# Patient Record
Sex: Female | Born: 1945
Health system: Southern US, Community
[De-identification: ages and names within clinical notes are randomized; demographics above are authoritative.]

## PROBLEM LIST (undated history)

## (undated) DIAGNOSIS — E785 Hyperlipidemia, unspecified: Secondary | ICD-10-CM

## (undated) DIAGNOSIS — L989 Disorder of the skin and subcutaneous tissue, unspecified: Secondary | ICD-10-CM

## (undated) DIAGNOSIS — I10 Essential (primary) hypertension: Secondary | ICD-10-CM

## (undated) DIAGNOSIS — E7849 Other hyperlipidemia: Secondary | ICD-10-CM

## (undated) HISTORY — DX: Hyperlipidemia, unspecified: E78.5

## (undated) HISTORY — DX: Essential (primary) hypertension: I10

## (undated) HISTORY — DX: Other hyperlipidemia: E78.49

## (undated) HISTORY — DX: Disorder of the skin and subcutaneous tissue, unspecified: L98.9

---

## 2005-12-15 ENCOUNTER — Ambulatory Visit (HOSPITAL_COMMUNITY): Admission: RE | Admit: 2005-12-15 | Discharge: 2005-12-15 | Payer: Self-pay | Admitting: Pulmonary Disease

## 2006-01-07 ENCOUNTER — Ambulatory Visit (HOSPITAL_COMMUNITY): Admission: RE | Admit: 2006-01-07 | Discharge: 2006-01-07 | Payer: Self-pay | Admitting: Pulmonary Disease

## 2012-03-15 DIAGNOSIS — E785 Hyperlipidemia, unspecified: Secondary | ICD-10-CM | POA: Diagnosis not present

## 2012-03-15 DIAGNOSIS — Z23 Encounter for immunization: Secondary | ICD-10-CM | POA: Diagnosis not present

## 2012-12-16 DIAGNOSIS — Z79899 Other long term (current) drug therapy: Secondary | ICD-10-CM | POA: Diagnosis not present

## 2012-12-16 DIAGNOSIS — E785 Hyperlipidemia, unspecified: Secondary | ICD-10-CM | POA: Diagnosis not present

## 2012-12-16 DIAGNOSIS — I10 Essential (primary) hypertension: Secondary | ICD-10-CM | POA: Diagnosis not present

## 2012-12-20 ENCOUNTER — Encounter: Payer: Self-pay | Admitting: Obstetrics and Gynecology

## 2012-12-22 ENCOUNTER — Ambulatory Visit (INDEPENDENT_AMBULATORY_CARE_PROVIDER_SITE_OTHER): Payer: PRIVATE HEALTH INSURANCE | Admitting: Obstetrics and Gynecology

## 2012-12-22 ENCOUNTER — Encounter: Payer: Self-pay | Admitting: Obstetrics and Gynecology

## 2012-12-22 VITALS — BP 120/82 | Ht 63.0 in | Wt <= 1120 oz

## 2012-12-22 DIAGNOSIS — N8111 Cystocele, midline: Secondary | ICD-10-CM

## 2012-12-22 NOTE — Progress Notes (Signed)
Patient ID: Ruth Barry, female   DOB: 04/15/1946, 67 y.o.   MRN: [id number] Pt here today to check her bladder. Pt states she thinks her bladder has dropped. Referred Dr Juanetta Gosling office. prob x 6  Mos. SUI sx: good control Urge:  Morning urge.doesnt lose control BM's okay; Surg hx no gyn surgery Physical Examination: BP 120/82  Ht 5\' 3"  (1.6 m)  Wt 11 lb 9.6 oz (5.262 kg)  BMI 2.06 kg/m2 Physical Examination: General appearance - alert, well appearing, and in no distress, oriented to person, place, and time and normal appearing weight Mental status - alert, oriented to person, place, and time, normal mood, behavior, speech, dress, motor activity, and thought processes  Eyes - pupils equal and reactive, extraocular eye movements intact Neck - supple, no significant adenopathy Heart - normal rate and regular rhythm Abdomen - soft, nontender, nondistended, no masses or organomegaly Pelvic - normal external genitalia, vulva, vagina, cervix, uterus and adnexa, exam chaperoned by RN janet.Marland Kitchen

## 2012-12-22 NOTE — Patient Instructions (Signed)
No evidence of cystocele.  You are doing very well.  Below is informaiton on cystocele just for education. Cystocele Repair A cystocele is a bulging, drooping hernia or break (rupture) of bladder tissue into the birth canal (vagina). This bulging or rupture occurs on the top front wall of the vagina. CAUSES  Cystocele is associated with weakness of the top front wall of the vagina due to stretching and tearing of the ligaments and muscles in the area. This is often the result of:  Multiple childbirths.  Continuous heavy lifting.  Chronic cough from asthma, emphysema, or smoking.  Being overweight.  Changes from aging.  Previous surgery in the vaginal area.  Menopause with loss of estrogen hormone and weakening of the ligaments and muscles around the bladder. SYMPTOMS   Uncontrolled loss of urine (incontinence) with cough, sneeze, or exercise.  Pelvic pressure.  Frequency or urgency to urinate because of inability to completely empty the bladder.  Bladder infections.  Needing to push on the upper vagina to help yourself pass urine. DIAGNOSIS  A cystocele can be diagnosed by doing a pelvic exam and observing the top of the vagina drooping or bulging into or out of the vagina. TREATMENT  Surgical options:  Cystocele repair is surgery that removes the hernia.  There are also different "sling" operations that may be used. Discuss the different types of surgeries to repair a cystocele with your caregiver. Your caregiver will decide what type of surgery will be best in your case. Nonsurgical options:  Kegel exercises. This helps strengthen and tighten the muscles and tissue in and around the bladder and vagina. This may help with mild cases of cystocele.  A pessary may help the cystocele. A pessary is a plastic or rubber device that lifts the bladder into place. A pessary must be fitted by a doctor.  Tampons or diaphragms that lift the bladder into place are sometimes helpful  with a minor or small cystocele.  Estrogen may help with mild cases in menopausal and aging women. LET YOUR CAREGIVER KNOW ABOUT:   Allergies to food or medicine.  Medicines taken, including vitamins, herbs, eyedrops, over-the-counter medicines, and creams.  Use of steroids (by mouth or creams).  Previous problems with anesthetics or numbing medicines.  History of bleeding problems or blood clots.  Previous surgery.  Other health problems, including diabetes and kidney problems.  Possibility of pregnancy, if this applies. RISKS AND COMPLICATIONS  All surgery is associated with risks.  There are risks with a general anesthesia. You should discuss this with your caregiver.  With spinal or epidural anesthesia, there may be an area that is not numbed, and you could feel pain.  Headache could occur with a spinal or epidural anesthetic.  The catheter you will have after surgery may not work properly or may get blocked and need to be replaced.  Excessive bleeding.  Infection.  Injury to surrounding structures.  Recurrence of the cystocele.  Surgery may not get rid of your symptoms. BEFORE THE PROCEDURE   Do not take aspirin or blood thinners for 1 week prior to surgery, unless instructed otherwise.  Do not eat or drink anything after midnight the night before surgery.  Let your caregiver know if you develop a cold or other infectious problems prior to surgery.  If being admitted the day of surgery, you should be present 1 hour prior to your procedure or as directed by your caregiver.  Plan and arrange for help when you go home from the  hospital.  If you smoke, do not smoke for at least 2 weeks before the surgery.  Do not drink any alcohol for 3 days before the surgery. PROCEDURE  You will be given an anesthetic to prevent you from feeling pain during surgery. This may be a general anesthetic that puts you to sleep, or a spinal or epidural anesthetic. You will be  asleep or be numbed through the entire procedure. During cystocele repair, tissue is pulled from the sides and around the top of the vagina to lift up the hernia. This removes the hernia so that the top of the vagina does not fall into the opening of the vagina. AFTER THE PROCEDURE  After surgery, you will be taken to the recovery room where a nurse will take care of you, checking your breathing, blood pressure, pulse, and your progress. When your caregiver feels you are stable, you will be taken to your room. You will have a drainage tube (Foley catheter) that will drain your bladder for 2 to 7 days or longer, until your bladder is working properly. This catheter is placed prior to surgery to help keep your bladder empty and out of the way during the procedure. After surgery, this will make passing your urine easier. The catheter will be removed when you can easily pass urine without this assistance. You may have gauze packing in the vagina that will be removed 1 to 2 days after the surgery. Usually, you will be given a medicine (antibiotic) that kills germs. You will be given pain medicine as needed. You can usually go home in 3 to 5 days. HOME CARE INSTRUCTIONS   Do not take baths. Take showers until your caregiver informs you otherwise.  Take antibiotics as directed by your caregiver.  Exercise as instructed. Do not perform exercises which increase the pressure inside your belly (abdomen), such as sit-ups or lifting weights, until your caregiver has given permission. Walking exercise is preferred.  Only take over-the-counter or prescription medicines for pain and discomfort as directed by your caregiver.  Do not drink alcohol while taking pain medicine.  Do not lift anything over 5 pounds.  Do not drive until your caregiver gives you permission.  Get plenty of rest and sleep.  Have someone help with your household chores for 1 to 2 weeks.  If you develop constipation, you may take a mild  laxative with your caregiver's permission. Eating bran foods and drinking enough water and fluids to keep your urine clear or pale yellow helps with constipation.  Do not take aspirin. It may cause bleeding.  You may resume normal diet and unstrenuous activities as directed.  Do not douche, use tampons, or engage in intercourse until your surgeon has given permission.  Change bandages (dressings) as directed.  Make and keep all your postoperative appointments. SEEK MEDICAL CARE IF:   You have abnormal vaginal discharge.  You develop a rash.  You are having a reaction to your medicine.  You develop nausea or vomiting. SEEK IMMEDIATE MEDICAL CARE IF:   You have redness, swelling, or increasing pain in the vaginal area.  You notice pus coming from the vagina.  You have a fever.  You notice a bad smell coming from the vagina.  You have increasing abdominal pain.  You have frequent urination or you notice burning during urination.  You notice blood in your urine.  You have excessive vaginal bleeding.  You cannot urinate. MAKE SURE YOU:   Understand these instructions.  Will watch  your condition.  Will get help right away if you are not doing well or get worse. Document Released: 05/23/2000 Document Revised: 08/18/2011 Document Reviewed: 08/23/2009 [facility] Patient Information 2014 [place], [place].

## 2013-02-09 DIAGNOSIS — Z23 Encounter for immunization: Secondary | ICD-10-CM | POA: Diagnosis not present

## 2014-03-09 DIAGNOSIS — Z23 Encounter for immunization: Secondary | ICD-10-CM | POA: Diagnosis not present

## 2014-04-04 DIAGNOSIS — I10 Essential (primary) hypertension: Secondary | ICD-10-CM | POA: Diagnosis not present

## 2014-04-04 DIAGNOSIS — Z79899 Other long term (current) drug therapy: Secondary | ICD-10-CM | POA: Diagnosis not present

## 2014-04-04 DIAGNOSIS — Z0000 Encounter for general adult medical examination without abnormal findings: Secondary | ICD-10-CM | POA: Diagnosis not present

## 2014-04-04 DIAGNOSIS — E784 Other hyperlipidemia: Secondary | ICD-10-CM | POA: Diagnosis not present

## 2014-04-04 LAB — TSH: TSH: 1.48 u[IU]/mL (ref 0.41–5.90)

## 2014-04-04 LAB — LIPID PANEL
Cholesterol: 204 mg/dL — AB (ref 0–200)
HDL: 60 mg/dL (ref 35–70)
LDL CALC: 127 mg/dL
TRIGLYCERIDES: 86 mg/dL (ref 40–160)

## 2014-04-04 LAB — CBC AND DIFFERENTIAL
HCT: 41 % (ref 36–46)
Hemoglobin: 13.9 g/dL (ref 12.0–16.0)
Platelets: 260 10*3/uL (ref 150–399)
WBC: 4.3 10*3/mL

## 2014-04-10 DIAGNOSIS — E784 Other hyperlipidemia: Secondary | ICD-10-CM | POA: Diagnosis not present

## 2014-04-10 DIAGNOSIS — N8111 Cystocele, midline: Secondary | ICD-10-CM | POA: Diagnosis not present

## 2014-04-10 DIAGNOSIS — I10 Essential (primary) hypertension: Secondary | ICD-10-CM | POA: Diagnosis not present

## 2014-04-11 DIAGNOSIS — I10 Essential (primary) hypertension: Secondary | ICD-10-CM | POA: Diagnosis not present

## 2014-04-11 DIAGNOSIS — E784 Other hyperlipidemia: Secondary | ICD-10-CM | POA: Diagnosis not present

## 2014-04-11 DIAGNOSIS — N8111 Cystocele, midline: Secondary | ICD-10-CM | POA: Diagnosis not present

## 2014-04-18 ENCOUNTER — Encounter: Payer: Self-pay | Admitting: *Deleted

## 2014-04-24 ENCOUNTER — Encounter: Payer: Self-pay | Admitting: Obstetrics and Gynecology

## 2014-04-24 ENCOUNTER — Ambulatory Visit (INDEPENDENT_AMBULATORY_CARE_PROVIDER_SITE_OTHER): Payer: Medicare Other | Admitting: Obstetrics and Gynecology

## 2014-04-24 ENCOUNTER — Other Ambulatory Visit (HOSPITAL_COMMUNITY)
Admission: RE | Admit: 2014-04-24 | Discharge: 2014-04-24 | Disposition: A | Discharge status: Home / Self Care | Payer: Medicare Other | Source: Ambulatory Visit | Attending: Obstetrics and Gynecology | Admitting: Obstetrics and Gynecology

## 2014-04-24 VITALS — BP 130/80 | Ht 63.0 in | Wt 113.0 lb

## 2014-04-24 DIAGNOSIS — N8110 Cystocele, unspecified: Secondary | ICD-10-CM

## 2014-04-24 DIAGNOSIS — Z124 Encounter for screening for malignant neoplasm of cervix: Secondary | ICD-10-CM

## 2014-04-24 DIAGNOSIS — Z1151 Encounter for screening for human papillomavirus (HPV): Secondary | ICD-10-CM | POA: Diagnosis not present

## 2014-04-24 DIAGNOSIS — Z01419 Encounter for gynecological examination (general) (routine) without abnormal findings: Secondary | ICD-10-CM | POA: Insufficient documentation

## 2014-04-24 DIAGNOSIS — IMO0002 Reserved for concepts with insufficient information to code with codable children: Secondary | ICD-10-CM

## 2014-04-24 LAB — HM PAP SMEAR: HM Pap smear: NEGATIVE

## 2014-04-24 NOTE — Patient Instructions (Signed)

## 2014-04-24 NOTE — Progress Notes (Signed)
Patient ID: Ruth Barry, female   DOB: 06-04-1946, 68 y.o.   MRN: [id number]   [facility] Visit  Patient name: Ruth Barry MRN [id number]  Date of birth: 04-05-1946  CC & HPI:  Ruth Barry is a 68 y.o. female presenting today for trouble emptying her bladder due to a possible bladder drop.  Positive for urgency symptoms but not for incontinence and dysuria.  Her last PAP was more than 5 years ago.  She was referred here by her PCP Dr. Luan Pulling.  She does not have a history of hysterectomy.  She has a history of 2, normal vaginal deliveries.    ROS:  All systems have been reviewed and are negative unless otherwise indicated in the HPI.  Pertinent History Reviewed:   Reviewed: Significant for  Medical         Past Medical History  Diagnosis Date  . Hypertension   . Hyperlipidemia                               Surgical Hx:   History reviewed. No pertinent past surgical history. Medications: Reviewed & Updated - see associated section                      Current outpatient prescriptions: amLODipine (NORVASC) 5 MG tablet, Take 5 mg by mouth daily., Disp: , Rfl: ;  benazepril (LOTENSIN) 20 MG tablet, Take 20 mg by mouth daily., Disp: , Rfl: ;  lovastatin (MEVACOR) 40 MG tablet, Take 40 mg by mouth at bedtime., Disp: , Rfl:    Social History: Reviewed -  reports that she has never smoked. She has never used smokeless tobacco.  Objective Findings:  Vitals: Blood pressure 130/80, height 5\' 3"  (1.6 m), weight 113 lb (51.256 kg).  Physical Examination: General appearance - alert, well appearing, and in no distress, oriented to person, place, and time and normal appearing weight Pelvic - EXTERNAL GENITALIA: normal VULVA: normal appearing vulva with no masses, tenderness or lesions,  VAGINA: normal appearing vagina with normal color and discharge, no lesions, large cystocele with second degree uterine descensus,  CERVIX: normal appearing cervix without discharge or lesions,   UTERUS: uterus is normal size, shape, consistency and nontender, retroverted, mobile, small ADNEXA: normal adnexa in size, nontender and no masses RECTAL: some loss of support anteriorly   Assessment & Plan:   A:  1. Large cystocele with rotation of anterior wall with valsalva  2. Second degree uterine descensus  Pt currently not interested in hysterectomy, pro's and con's of uterine preservation discussed at length, and pt aware that there is  A real possibility of continued progression of the uterine descensus, and pt accepts that risk   P:  1. Discuss pessaries vs. Surgical options pt wants surgery, cystocele repair only 2. Will order pelvic u/s to insure there is no abnormalities of uterus or adnexa 3. Pap done.  This chart was scribed for [name], MD by Donato Schultz, ED Scribe. This patient was seen in Room 2 and the patient's care was started at 10:31 AM.

## 2014-04-24 NOTE — Progress Notes (Signed)
Patient ID: Ruth Barry, female   DOB: 25-Jan-1946, 68 y.o.   MRN: [id number] Pt here today for possible bladder drop. Dr. Luan Pulling sent her here to check the symptoms she is having. Pt states that she can feel something down there every morning. Pt states that she has trouble emptying her bladder at times. Pt denies any pain but states that she has trouble emptying her bladder.

## 2014-04-25 ENCOUNTER — Other Ambulatory Visit: Payer: Self-pay | Admitting: Obstetrics and Gynecology

## 2014-04-25 DIAGNOSIS — IMO0002 Reserved for concepts with insufficient information to code with codable children: Secondary | ICD-10-CM

## 2014-04-25 DIAGNOSIS — N814 Uterovaginal prolapse, unspecified: Secondary | ICD-10-CM

## 2014-04-26 LAB — CYTOLOGY - PAP

## 2014-05-01 ENCOUNTER — Other Ambulatory Visit: Payer: Self-pay | Admitting: Obstetrics and Gynecology

## 2014-05-01 ENCOUNTER — Ambulatory Visit (INDEPENDENT_AMBULATORY_CARE_PROVIDER_SITE_OTHER): Payer: Medicare Other | Admitting: Obstetrics and Gynecology

## 2014-05-01 ENCOUNTER — Ambulatory Visit (INDEPENDENT_AMBULATORY_CARE_PROVIDER_SITE_OTHER): Payer: Medicare Other

## 2014-05-01 VITALS — BP 140/78 | Ht 62.0 in | Wt 112.0 lb

## 2014-05-01 DIAGNOSIS — N814 Uterovaginal prolapse, unspecified: Secondary | ICD-10-CM | POA: Diagnosis not present

## 2014-05-01 DIAGNOSIS — N8110 Cystocele, unspecified: Secondary | ICD-10-CM

## 2014-05-01 DIAGNOSIS — IMO0002 Reserved for concepts with insufficient information to code with codable children: Secondary | ICD-10-CM

## 2014-05-01 DIAGNOSIS — D259 Leiomyoma of uterus, unspecified: Secondary | ICD-10-CM | POA: Diagnosis not present

## 2014-05-01 NOTE — Progress Notes (Signed)
Patient ID: LEWANDA PEREA, female   DOB: 1945-12-28, 68 y.o.   MRN: [id number]   [facility] Visit  Patient name: CHARLESA EHLE MRN [id number]  Date of birth: 03-02-1946  CC & HPI:  DENISE WASHBURN is a 68 y.o. female presenting today for a transvaginal u/s follow-up.  Her u/s revealed a 3 cm intrauterine mass that does not raise any serious concerns.  She denies vaginal discharge or bleeding as associated symptoms but states that she can feel her bladder dropping.    ROS:  All systems have been reviewed and are negative unless otherwise specified in the HPI.  Pertinent History Reviewed:   Reviewed: Significant for  Medical         Past Medical History  Diagnosis Date  . Hypertension   . Hyperlipidemia                               Surgical Hx:   No past surgical history on file. Medications: Reviewed & Updated - see associated section                      Current outpatient prescriptions: amLODipine (NORVASC) 5 MG tablet, Take 5 mg by mouth daily., Disp: , Rfl: ;  benazepril (LOTENSIN) 20 MG tablet, Take 20 mg by mouth daily., Disp: , Rfl: ;  lovastatin (MEVACOR) 40 MG tablet, Take 40 mg by mouth at bedtime., Disp: , Rfl:    Social History: Reviewed -  reports that she has never smoked. She has never used smokeless tobacco.  Objective Findings:  Vitals: Blood pressure 140/78, height 5\' 2"  (1.575 m), weight 112 lb (50.803 kg).  Physical Examination: Discussion only   Assessment & Plan:   A:  1. Cystocele  2. First degree uterine descensus, not interested in hysterectomy 3. Asymptomatic uterine fibroid, intrauterine  P:  1. Follow-up in 2 wks for pre-op  2. Schedule pt for anterior repair early Dec  This chart was scribed for [name], MD by Donato Schultz, ED Scribe. This patient was seen in Room 2 and the patient's care was started at 11:28 AM.

## 2014-05-16 ENCOUNTER — Ambulatory Visit (INDEPENDENT_AMBULATORY_CARE_PROVIDER_SITE_OTHER): Payer: Medicare Other

## 2014-05-16 ENCOUNTER — Other Ambulatory Visit: Payer: Medicare Other | Admitting: Obstetrics and Gynecology

## 2014-05-16 ENCOUNTER — Other Ambulatory Visit: Payer: Self-pay | Admitting: Obstetrics and Gynecology

## 2014-05-16 ENCOUNTER — Ambulatory Visit (INDEPENDENT_AMBULATORY_CARE_PROVIDER_SITE_OTHER): Payer: Medicare Other | Admitting: Obstetrics and Gynecology

## 2014-05-16 ENCOUNTER — Encounter: Payer: Self-pay | Admitting: Obstetrics and Gynecology

## 2014-05-16 VITALS — BP 136/76 | Ht 62.0 in

## 2014-05-16 DIAGNOSIS — N9489 Other specified conditions associated with female genital organs and menstrual cycle: Secondary | ICD-10-CM

## 2014-05-16 DIAGNOSIS — N814 Uterovaginal prolapse, unspecified: Secondary | ICD-10-CM | POA: Diagnosis not present

## 2014-05-16 DIAGNOSIS — D251 Intramural leiomyoma of uterus: Secondary | ICD-10-CM | POA: Diagnosis not present

## 2014-05-16 NOTE — Patient Instructions (Signed)
Ruth Barry  05/16/2014   Your procedure is scheduled on:   05/23/14  Report to [facility] at  42  AM.  Call this number if you have problems the morning of surgery: [phone]   Remember:   Do not eat food or drink liquids after midnight.   Take these medicines the morning of surgery with A SIP OF WATER: amlodipine, benazepril  Do not wear jewelry, make-up or nail polish.  Do not wear lotions, powders, or perfumes.  Do not shave 48 hours prior to surgery. Men may shave face and neck.  Do not bring valuables to the hospital.  [facility] is not responsible for any belongings or valuables.               Contacts, dentures or bridgework may not be worn into surgery.  Leave suitcase in the car. After surgery it may be brought to your room.  For patients admitted to the hospital, discharge time is determined by your treatment team.               Patients discharged the day of surgery will not be allowed to drive home.  Name and phone number of your driver: family  Special Instructions: Shower using CHG 2 nights before surgery and the night before surgery.  If you shower the day of surgery use CHG.  Use special wash - you have one bottle of CHG for all showers.  You should use approximately 1/3 of the bottle for each shower.   Please read over the following fact sheets that you were given: Pain Booklet, Coughing and Deep Breathing, Surgical Site Infection Prevention, Anesthesia Post-op Instructions and Care and Recovery After Surgery Cystocele Repair Cystocele repair is surgery to remove a cystocele, which is a bulging, drooping area (hernia) of the bladder that extends into the vagina. This bulging occurs on the top front wall of the vagina. LET [facility] CARE PROVIDER KNOW ABOUT:   Any allergies you have.  All medicines you are taking, including vitamins, herbs, eye drops, creams, and over-the-counter medicines.  Use of steroids (by mouth or creams).  Previous problems  you or members of your family have had with the use of anesthetics.  Any blood disorders you have.  Previous surgeries you have had.  Medical conditions you have.  Possibility of pregnancy, if this applies. RISKS AND COMPLICATIONS  Generally, this is a safe procedure. However, as with any procedure, complications can occur. Possible complications include:  Excessive bleeding.  Infection.  Injury to surrounding structures.  Problems related to anesthetics. The risks will vary depending on the type of anesthetic given.  Problems with the urinary catheter after surgery, such as blockage.  Return of the cystocele. BEFORE THE PROCEDURE   Ask your health care provider about changing or stopping your regular medicines. You may need to stop taking certain medicines 1 week before surgery.  Do not eat or drink anything after midnight the night before surgery.  If you smoke, do not smoke for at least 2 weeks before the surgery.  Do not drink any alcohol for 3 days before the surgery.  Arrange for someone to drive you home after your hospital stay and to help you with activities during recovery. PROCEDURE   You will be given a medicine that makes you sleep through the procedure (general anesthetic) or a medicine injected into your spine that numbs your body below the waist (spinal or epidural anesthetic). You will be  asleep or be numbed through the entire procedure.  A thin, flexible tube (Foley catheter) will be placed in your bladder to drain urine during and after the surgery.  The surgery is performed through the vagina. The front wall of the vagina is opened up, and the muscle between the bladder and vagina is pulled up to its normal position. This is reinforced with stitches or a piece of mesh. This removes the hernia so that the top of the vagina does not fall into the opening of the vagina.  The cut on the front wall of the vagina is then closed with absorbable stitches that do  not need to be removed. AFTER THE PROCEDURE   You will be taken to a recovery area where your progress will be closely watched. Your breathing, blood pressure, and pulse (vital signs) will be checked often. When you are stable, you will be taken to a regular hospital room.  You will have a catheter in place to drain your bladder. This will stay in place for 2-7 days or until your bladder is working properly on its own.  You may have gauze packing in the vagina. This will be removed 1-2 days after the surgery.  You will be given pain medicine as needed and may be given a medicine that kills germs (antibiotic).  You will likely need to stay in the hospital for 1-2 days. Document Released: 05/23/2000 Document Revised: 01/26/2013 Document Reviewed: 11/12/2012 [facility] Patient Information 2015 [place], [place]. This information is not intended to replace advice given to you by your health care provider. Make sure you discuss any questions you have with your health care provider. PATIENT INSTRUCTIONS POST-ANESTHESIA  IMMEDIATELY FOLLOWING SURGERY:  Do not drive or operate machinery for the first twenty four hours after surgery.  Do not make any important decisions for twenty four hours after surgery or while taking narcotic pain medications or sedatives.  If you develop intractable nausea and vomiting or a severe headache please notify your doctor immediately.  FOLLOW-UP:  Please make an appointment with your surgeon as instructed. You do not need to follow up with anesthesia unless specifically instructed to do so.  WOUND CARE INSTRUCTIONS (if applicable):  Keep a dry clean dressing on the anesthesia/puncture wound site if there is drainage.  Once the wound has quit draining you may leave it open to air.  Generally you should leave the bandage intact for twenty four hours unless there is drainage.  If the epidural site drains for more than 36-48 hours please call the anesthesia  department.  QUESTIONS?:  Please feel free to call your physician or the hospital operator if you have any questions, and they will be happy to assist you.

## 2014-05-16 NOTE — Progress Notes (Signed)
Patient ID: Ruth Barry, female   DOB: [birth year], 68 y.o.   MRN: [id number] Pt here today for pre op visit.    [facility] Visit  Patient name: Ruth Barry MRN [id number]  Date of birth: 30-Mar-1946  CC & HPI:  Ruth Barry is a 68 y.o. female presenting today for preop eval for Ant repair. Pt scheduled for anterior repair on 05/23/14, her last pelvic exam was done on 04/24/14.  Review of the u/s shows the endometrium to be deformed by the 4 cm solid mass in the uterus, and I cannot tell if the mass, a suspected fibroid, is intramural or pedunculated. A sonohysterogram is recommended to further delineate anatomy, as a pedunculated fibroid would warrant excision at time of surgery. Sonohysterogram done,after consented, and to be documented. Findings reveal an intramural mass consistent with a stable fibroid. Pt denies any PMB or PCB.  Pt has bulge sensation that seems related to cystocele.pro's and cons of uterine preservation reviewed, pt desires the minimal surgery for her sx.   ROS:  Negative CV hx.  Pertinent History Reviewed:   Reviewed: Significant for  Medical         Past Medical History  Diagnosis Date  . Hypertension   . Hyperlipidemia                               Surgical Hx:   History reviewed. No pertinent past surgical history. Medications: Reviewed & Updated - see associated section                      Current outpatient prescriptions: amLODipine (NORVASC) 5 MG tablet, Take 5 mg by mouth daily., Disp: , Rfl: ;  benazepril (LOTENSIN) 20 MG tablet, Take 20 mg by mouth daily., Disp: , Rfl: ;  lovastatin (MEVACOR) 40 MG tablet, Take 40 mg by mouth at bedtime., Disp: , Rfl:    Social History: Reviewed -  reports that she has never smoked. She has never used smokeless tobacco.  Objective Findings:  Vitals: Blood pressure 136/76, height 5\' 2"  (1.575 m).  Physical Examination: General appearance - alert, well appearing, and in no distress, oriented to  person, place, and time and normal appearing weight Mental status - alert, oriented to person, place, and time, normal mood, behavior, speech, dress, motor activity, and thought processes, affect appropriate to mood Eyes - pupils equal and reactive, extraocular eye movements intact Chest - clear to auscultation, no wheezes, rales or rhonchi, symmetric air entry Heart - normal rate and regular rhythm Abdomen - soft, nontender, nondistended, no masses or organomegaly Breasts -  Physical Examination: Pelvic - normal external genitalia, vulva, vagina, cervix, uterus and adnexa, moderate cystocele Rectal - normal rectal, no masses, no rectocele Neurological - alert, oriented, normal speech, no focal findings or movement disorder noted,   U/s : SHG shows a thin endometrial atripe with no suspected pathology    Assessment & Plan:   A:  1. Cystocele 2. Fundal uterine fibroid, intramural, assymptomatic.   P:  1. Proceed to anterior repair, will leave uterus in situ. Alternatives , risks rationale reviewed with the patient and her daughter.

## 2014-05-16 NOTE — H&P (Signed)
  Progress Notes    Expand All Collapse All   Patient ID: Ruth Barry, female DOB: March 01, 1946, 68 y.o. MRN: [id number] Pt here today for pre op visit.   [facility] Visit  Patient name: Ruth Barry [id number] Date of birth: [birth year]  CC & HPI:  Ruth Barry is a 68 y.o. female presenting today for preop eval for Ant repair. Pt scheduled for anterior repair on 05/23/14, her last pelvic exam was done on 04/24/14. Review of the u/s shows the endometrium to be deformed by the 4 cm solid mass in the uterus, and I cannot tell if the mass, a suspected fibroid, is intramural or pedunculated. A sonohysterogram is recommended to further delineate anatomy, as a pedunculated fibroid would warrant excision at time of surgery. Sonohysterogram done,after consented, and to be documented. Findings reveal an intramural mass consistent with a stable fibroid. Pt denies any PMB or PCB. Pt has bulge sensation that seems related to cystocele.pro's and cons of uterine preservation reviewed, pt desires the minimal surgery for her sx.   ROS:  Negative CV hx.  Pertinent History Reviewed:  Reviewed: Significant for  Medical  Past Medical History  Diagnosis Date  . Hypertension   . Hyperlipidemia     Surgical Hx: History reviewed. No pertinent past surgical history. Medications: Reviewed & Updated - see associated section  Current outpatient prescriptions: amLODipine (NORVASC) 5 MG tablet, Take 5 mg by mouth daily., Disp: , Rfl: ; benazepril (LOTENSIN) 20 MG tablet, Take 20 mg by mouth daily., Disp: , Rfl: ; lovastatin (MEVACOR) 40 MG tablet, Take 40 mg by mouth at bedtime., Disp: , Rfl:    Social History: Reviewed -  reports that she has never smoked. She has never used smokeless tobacco.  Objective Findings:  Vitals: Blood pressure 136/76, height 5\' 2"  (1.575 m).  Physical  Examination: General appearance - alert, well appearing, and in no distress, oriented to person, place, and time and normal appearing weight Mental status - alert, oriented to person, place, and time, normal mood, behavior, speech, dress, motor activity, and thought processes, affect appropriate to mood Eyes - pupils equal and reactive, extraocular eye movements intact Chest - clear to auscultation, no wheezes, rales or rhonchi, symmetric air entry Heart - normal rate and regular rhythm Abdomen - soft, nontender, nondistended, no masses or organomegaly Breasts -  Physical Examination: Pelvic - normal external genitalia, vulva, vagina, cervix, uterus and adnexa, moderate cystocele Rectal - normal rectal, no masses, no rectocele Neurological - alert, oriented, normal speech, no focal findings or movement disorder noted,   U/s : SHG shows a thin endometrial atripe with no suspected pathology    Assessment & Plan:   A:  1. Cystocele 2. Fundal uterine fibroid, intramural, assymptomatic.   P:  1. Proceed to anterior repair, will leave uterus in situ. Alternatives , risks rationale reviewed with the patient and her daughter.

## 2014-05-17 ENCOUNTER — Encounter (HOSPITAL_COMMUNITY)
Admission: RE | Admit: 2014-05-17 | Discharge: 2014-05-17 | Disposition: A | Discharge status: Home / Self Care | Payer: Medicare Other | Source: Ambulatory Visit | Attending: Obstetrics and Gynecology | Admitting: Obstetrics and Gynecology

## 2014-05-17 ENCOUNTER — Encounter (HOSPITAL_COMMUNITY): Payer: Self-pay

## 2014-05-17 ENCOUNTER — Other Ambulatory Visit: Payer: Self-pay

## 2014-05-17 DIAGNOSIS — Z01818 Encounter for other preprocedural examination: Secondary | ICD-10-CM | POA: Insufficient documentation

## 2014-05-17 LAB — URINALYSIS, ROUTINE W REFLEX MICROSCOPIC
Bilirubin Urine: NEGATIVE
GLUCOSE, UA: NEGATIVE mg/dL
HGB URINE DIPSTICK: NEGATIVE
Ketones, ur: NEGATIVE mg/dL
Nitrite: NEGATIVE
PH: 5.5 (ref 5.0–8.0)
PROTEIN: NEGATIVE mg/dL
SPECIFIC GRAVITY, URINE: 1.01 (ref 1.005–1.030)
UROBILINOGEN UA: 0.2 mg/dL (ref 0.0–1.0)

## 2014-05-17 LAB — CBC
HCT: 42.7 % (ref 36.0–46.0)
Hemoglobin: 14.4 g/dL (ref 12.0–15.0)
MCH: 30.3 pg (ref 26.0–34.0)
MCHC: 33.7 g/dL (ref 30.0–36.0)
MCV: 89.7 fL (ref 78.0–100.0)
PLATELETS: 235 10*3/uL (ref 150–400)
RBC: 4.76 MIL/uL (ref 3.87–5.11)
RDW: 13.6 % (ref 11.5–15.5)
WBC: 4.4 10*3/uL (ref 4.0–10.5)

## 2014-05-17 LAB — BASIC METABOLIC PANEL
ANION GAP: 11 (ref 5–15)
BUN: 16 mg/dL (ref 6–23)
CALCIUM: 10.1 mg/dL (ref 8.4–10.5)
CHLORIDE: 103 meq/L (ref 96–112)
CO2: 29 mEq/L (ref 19–32)
CREATININE: 0.76 mg/dL (ref 0.50–1.10)
GFR, EST NON AFRICAN AMERICAN: 85 mL/min — AB (ref >90)
GLUCOSE: 98 mg/dL (ref 70–99)
Potassium: 5.5 mEq/L — ABNORMAL HIGH (ref 3.7–5.3)
Sodium: 143 mEq/L (ref 137–147)

## 2014-05-17 LAB — URINE MICROSCOPIC-ADD ON

## 2014-05-17 NOTE — Pre-Procedure Instructions (Signed)
K+ 5.5. Dr Patsey Berthold aware and wants K+ repeated am of surgery.

## 2014-05-17 NOTE — Pre-Procedure Instructions (Signed)
Patient given information to sign up for my chart at home. 

## 2014-05-22 NOTE — H&P (Signed)
   [facility] Visit  Patient name: Ruth Barry [id number] Date of birth: [birth year]  CC & HPI:  Ruth Barry is a 68 y.o. female presenting today for preop eval for Ant repair. Pt scheduled for anterior repair on 05/23/14, her last pelvic exam was done on 04/24/14. Review of the u/s shows the endometrium to be deformed by the 4 cm solid mass in the uterus, and I cannot tell if the mass, a suspected fibroid, is intramural or pedunculated. A sonohysterogram is recommended to further delineate anatomy, as a pedunculated fibroid would warrant excision at time of surgery. Sonohysterogram done,after consented, and to be documented. Findings reveal an intramural mass consistent with a stable fibroid. Pt denies any PMB or PCB. Pt has bulge sensation that seems related to cystocele.pro's and cons of uterine preservation reviewed, pt desires the minimal surgery for her sx.   ROS:  Negative CV hx.  Pertinent History Reviewed:  Reviewed: Significant for  Medical  Past Medical History  Diagnosis Date  . Hypertension   . Hyperlipidemia     Surgical Hx: History reviewed. No pertinent past surgical history. Medications: Reviewed & Updated - see associated section  Current outpatient prescriptions: amLODipine (NORVASC) 5 MG tablet, Take 5 mg by mouth daily., Disp: , Rfl: ; benazepril (LOTENSIN) 20 MG tablet, Take 20 mg by mouth daily., Disp: , Rfl: ; lovastatin (MEVACOR) 40 MG tablet, Take 40 mg by mouth at bedtime., Disp: , Rfl:    Social History: Reviewed -  reports that she has never smoked. She has never used smokeless tobacco.  Objective Findings:  Vitals: Blood pressure 136/76, height 5\' 2"  (1.575 m).  Physical Examination: General appearance - alert, well appearing, and in no distress, oriented to person, place, and time and normal appearing weight Mental status - alert,  oriented to person, place, and time, normal mood, behavior, speech, dress, motor activity, and thought processes, affect appropriate to mood Eyes - pupils equal and reactive, extraocular eye movements intact Chest - clear to auscultation, no wheezes, rales or rhonchi, symmetric air entry Heart - normal rate and regular rhythm Abdomen - soft, nontender, nondistended, no masses or organomegaly Breasts -  Physical Examination: Pelvic - normal external genitalia, vulva, vagina, cervix, uterus and adnexa, moderate cystocele Rectal - normal rectal, no masses, no rectocele Neurological - alert, oriented, normal speech, no focal findings or movement disorder noted,   U/s : SHG shows a thin endometrial atripe with no suspected pathology    Assessment & Plan:   A:  1. Cystocele 2. Fundal uterine fibroid, intramural, assymptomatic.   P:  1. Proceed to anterior repair, will leave uterus in situ. Alternatives , risks rationale reviewed with the patient and her daughter.

## 2014-05-23 ENCOUNTER — Ambulatory Visit (HOSPITAL_COMMUNITY): Payer: Medicare Other | Admitting: Certified Registered Nurse Anesthetist

## 2014-05-23 ENCOUNTER — Observation Stay (HOSPITAL_COMMUNITY)
Admission: RE | Admit: 2014-05-23 | Discharge: 2014-05-24 | Disposition: A | Discharge status: Home / Self Care | Payer: Medicare Other | Source: Ambulatory Visit | Attending: Obstetrics and Gynecology | Admitting: Obstetrics and Gynecology

## 2014-05-23 ENCOUNTER — Encounter (HOSPITAL_COMMUNITY): Payer: Self-pay | Admitting: Certified Registered Nurse Anesthetist

## 2014-05-23 ENCOUNTER — Encounter (HOSPITAL_COMMUNITY)
Admission: RE | Disposition: A | Discharge status: Home / Self Care | Payer: Self-pay | Source: Ambulatory Visit | Attending: Obstetrics and Gynecology

## 2014-05-23 DIAGNOSIS — N8189 Other female genital prolapse: Secondary | ICD-10-CM | POA: Insufficient documentation

## 2014-05-23 DIAGNOSIS — I10 Essential (primary) hypertension: Secondary | ICD-10-CM | POA: Insufficient documentation

## 2014-05-23 DIAGNOSIS — E785 Hyperlipidemia, unspecified: Secondary | ICD-10-CM | POA: Insufficient documentation

## 2014-05-23 DIAGNOSIS — D251 Intramural leiomyoma of uterus: Secondary | ICD-10-CM | POA: Insufficient documentation

## 2014-05-23 DIAGNOSIS — N8110 Cystocele, unspecified: Principal | ICD-10-CM | POA: Insufficient documentation

## 2014-05-23 DIAGNOSIS — N814 Uterovaginal prolapse, unspecified: Secondary | ICD-10-CM | POA: Diagnosis present

## 2014-05-23 DIAGNOSIS — N812 Incomplete uterovaginal prolapse: Secondary | ICD-10-CM | POA: Diagnosis not present

## 2014-05-23 DIAGNOSIS — Z79899 Other long term (current) drug therapy: Secondary | ICD-10-CM | POA: Diagnosis not present

## 2014-05-23 DIAGNOSIS — IMO0002 Reserved for concepts with insufficient information to code with codable children: Secondary | ICD-10-CM | POA: Diagnosis present

## 2014-05-23 DIAGNOSIS — N898 Other specified noninflammatory disorders of vagina: Secondary | ICD-10-CM | POA: Diagnosis not present

## 2014-05-23 HISTORY — PX: CYSTOCELE REPAIR: SHX163

## 2014-05-23 HISTORY — PX: RECTOCELE REPAIR: SHX761

## 2014-05-23 LAB — POCT I-STAT 4, (NA,K, GLUC, HGB,HCT)
Glucose, Bld: 102 mg/dL — ABNORMAL HIGH (ref 70–99)
HCT: 44 % (ref 36.0–46.0)
Hemoglobin: 15 g/dL (ref 12.0–15.0)
Potassium: 4.2 mEq/L (ref 3.7–5.3)
SODIUM: 141 meq/L (ref 137–147)

## 2014-05-23 SURGERY — COLPORRHAPHY, ANTERIOR, FOR CYSTOCELE REPAIR
Anesthesia: General

## 2014-05-23 MED ORDER — FENTANYL CITRATE 0.05 MG/ML IJ SOLN
25.0000 ug | INTRAMUSCULAR | Status: DC
  Administered 2014-05-23 (×2): 25 ug via INTRAVENOUS
  Filled 2014-05-23: qty 2

## 2014-05-23 MED ORDER — IBUPROFEN 600 MG PO TABS: 600.0000 mg | ORAL_TABLET | Freq: Four times a day (QID) | ORAL | Status: DC | PRN

## 2014-05-23 MED ORDER — FENTANYL CITRATE 0.05 MG/ML IJ SOLN
INTRAMUSCULAR | Status: AC
  Filled 2014-05-23: qty 2

## 2014-05-23 MED ORDER — ONDANSETRON HCL 4 MG/2ML IJ SOLN: 4.0000 mg | Freq: Once | INTRAMUSCULAR | Status: DC | PRN

## 2014-05-23 MED ORDER — KETOROLAC TROMETHAMINE 30 MG/ML IJ SOLN
30.0000 mg | Freq: Four times a day (QID) | INTRAMUSCULAR | Status: DC
  Administered 2014-05-23 – 2014-05-24 (×3): 30 mg via INTRAVENOUS
  Filled 2014-05-23 (×3): qty 1

## 2014-05-23 MED ORDER — FENTANYL CITRATE 0.05 MG/ML IJ SOLN: 25.0000 ug | INTRAMUSCULAR | Status: DC | PRN

## 2014-05-23 MED ORDER — AMLODIPINE BESYLATE 5 MG PO TABS
5.0000 mg | ORAL_TABLET | Freq: Every day | ORAL | Status: DC
  Administered 2014-05-23 – 2014-05-24 (×2): 5 mg via ORAL
  Filled 2014-05-23 (×2): qty 1

## 2014-05-23 MED ORDER — BUPIVACAINE-EPINEPHRINE 0.5% -1:200000 IJ SOLN
INTRAMUSCULAR | Status: DC | PRN
  Administered 2014-05-23: 16 mL

## 2014-05-23 MED ORDER — LIDOCAINE HCL (PF) 1 % IJ SOLN
INTRAMUSCULAR | Status: AC
  Filled 2014-05-23: qty 5

## 2014-05-23 MED ORDER — SODIUM CHLORIDE 0.9 % IV SOLN
INTRAVENOUS | Status: DC
  Administered 2014-05-23 – 2014-05-24 (×3): via INTRAVENOUS

## 2014-05-23 MED ORDER — LACTATED RINGERS IV SOLN
INTRAVENOUS | Status: DC
  Administered 2014-05-23: 07:00:00 via INTRAVENOUS

## 2014-05-23 MED ORDER — MIDAZOLAM HCL 2 MG/2ML IJ SOLN
INTRAMUSCULAR | Status: AC
  Filled 2014-05-23: qty 2

## 2014-05-23 MED ORDER — DOCUSATE SODIUM 100 MG PO CAPS
100.0000 mg | ORAL_CAPSULE | Freq: Two times a day (BID) | ORAL | Status: DC
  Administered 2014-05-24: 100 mg via ORAL
  Filled 2014-05-23: qty 1

## 2014-05-23 MED ORDER — FENTANYL CITRATE 0.05 MG/ML IJ SOLN
INTRAMUSCULAR | Status: DC | PRN
  Administered 2014-05-23 (×4): 25 ug via INTRAVENOUS

## 2014-05-23 MED ORDER — HYDROMORPHONE HCL 1 MG/ML IJ SOLN: 1.0000 mg | INTRAMUSCULAR | Status: DC | PRN

## 2014-05-23 MED ORDER — DEXAMETHASONE SODIUM PHOSPHATE 4 MG/ML IJ SOLN
4.0000 mg | Freq: Once | INTRAMUSCULAR | Status: AC
  Administered 2014-05-23: 4 mg via INTRAVENOUS

## 2014-05-23 MED ORDER — TRAMADOL HCL 50 MG PO TABS
50.0000 mg | ORAL_TABLET | Freq: Four times a day (QID) | ORAL | Status: DC
  Administered 2014-05-23 – 2014-05-24 (×3): 50 mg via ORAL
  Filled 2014-05-23 (×3): qty 1

## 2014-05-23 MED ORDER — PROPOFOL 10 MG/ML IV BOLUS
INTRAVENOUS | Status: DC | PRN
  Administered 2014-05-23: 150 mg via INTRAVENOUS

## 2014-05-23 MED ORDER — ONDANSETRON HCL 4 MG/2ML IJ SOLN
INTRAMUSCULAR | Status: AC
  Filled 2014-05-23: qty 2

## 2014-05-23 MED ORDER — KETOROLAC TROMETHAMINE 30 MG/ML IJ SOLN: 30.0000 mg | Freq: Once | INTRAMUSCULAR | Status: DC

## 2014-05-23 MED ORDER — EPHEDRINE SULFATE 50 MG/ML IJ SOLN
INTRAMUSCULAR | Status: AC
  Filled 2014-05-23: qty 1

## 2014-05-23 MED ORDER — DEXAMETHASONE SODIUM PHOSPHATE 4 MG/ML IJ SOLN
INTRAMUSCULAR | Status: AC
  Filled 2014-05-23: qty 1

## 2014-05-23 MED ORDER — CEFAZOLIN SODIUM-DEXTROSE 2-3 GM-% IV SOLR
INTRAVENOUS | Status: DC | PRN
  Administered 2014-05-23: 2 g via INTRAVENOUS

## 2014-05-23 MED ORDER — ONDANSETRON HCL 4 MG/2ML IJ SOLN
INTRAMUSCULAR | Status: DC | PRN
  Administered 2014-05-23: 4 mg via INTRAVENOUS

## 2014-05-23 MED ORDER — BENAZEPRIL HCL 10 MG PO TABS
20.0000 mg | ORAL_TABLET | Freq: Every day | ORAL | Status: DC
  Administered 2014-05-23 – 2014-05-24 (×2): 20 mg via ORAL
  Filled 2014-05-23 (×2): qty 2

## 2014-05-23 MED ORDER — CEFAZOLIN SODIUM-DEXTROSE 2-3 GM-% IV SOLR
INTRAVENOUS | Status: AC
  Filled 2014-05-23: qty 50

## 2014-05-23 MED ORDER — MIDAZOLAM HCL 2 MG/2ML IJ SOLN
1.0000 mg | INTRAMUSCULAR | Status: DC | PRN
  Administered 2014-05-23: 2 mg via INTRAVENOUS

## 2014-05-23 MED ORDER — PANTOPRAZOLE SODIUM 40 MG PO TBEC: 40.0000 mg | DELAYED_RELEASE_TABLET | Freq: Every day | ORAL | Status: DC

## 2014-05-23 MED ORDER — PROMETHAZINE HCL 25 MG/ML IJ SOLN: 12.5000 mg | Freq: Four times a day (QID) | INTRAMUSCULAR | Status: DC | PRN

## 2014-05-23 MED ORDER — EPHEDRINE SULFATE 50 MG/ML IJ SOLN
INTRAMUSCULAR | Status: DC | PRN
  Administered 2014-05-23 (×3): 10 mg via INTRAVENOUS

## 2014-05-23 MED ORDER — KETOROLAC TROMETHAMINE 30 MG/ML IJ SOLN: 30.0000 mg | Freq: Four times a day (QID) | INTRAMUSCULAR | Status: DC

## 2014-05-23 MED ORDER — PROPOFOL 10 MG/ML IV EMUL
INTRAVENOUS | Status: AC
  Filled 2014-05-23: qty 20

## 2014-05-23 MED ORDER — PRAVASTATIN SODIUM 40 MG PO TABS
40.0000 mg | ORAL_TABLET | Freq: Every day | ORAL | Status: DC
Start: 2014-05-23 — End: 2014-05-24
  Administered 2014-05-23: 40 mg via ORAL
  Filled 2014-05-23: qty 1

## 2014-05-23 MED ORDER — ONDANSETRON HCL 4 MG/2ML IJ SOLN
4.0000 mg | Freq: Once | INTRAMUSCULAR | Status: AC
  Administered 2014-05-23: 4 mg via INTRAVENOUS

## 2014-05-23 MED ORDER — ONDANSETRON HCL 4 MG/2ML IJ SOLN: 4.0000 mg | Freq: Four times a day (QID) | INTRAMUSCULAR | Status: DC | PRN

## 2014-05-23 MED ORDER — ONDANSETRON HCL 4 MG PO TABS: 4.0000 mg | ORAL_TABLET | Freq: Four times a day (QID) | ORAL | Status: DC | PRN

## 2014-05-23 MED ORDER — LIDOCAINE HCL (CARDIAC) 10 MG/ML IV SOLN
INTRAVENOUS | Status: DC | PRN
  Administered 2014-05-23: 50 mg via INTRAVENOUS

## 2014-05-23 MED ORDER — LACTATED RINGERS IV SOLN
INTRAVENOUS | Status: DC | PRN
  Administered 2014-05-23 (×2): via INTRAVENOUS

## 2014-05-23 MED ORDER — CEFAZOLIN SODIUM-DEXTROSE 2-3 GM-% IV SOLR: 2.0000 g | INTRAVENOUS | Status: DC

## 2014-05-23 MED ORDER — STERILE WATER FOR IRRIGATION IR SOLN
Status: DC | PRN
  Administered 2014-05-23: 1000 mL

## 2014-05-23 MED ORDER — PROPOFOL 10 MG/ML IV BOLUS
INTRAVENOUS | Status: AC
  Filled 2014-05-23: qty 20

## 2014-05-23 MED ORDER — BUPIVACAINE-EPINEPHRINE (PF) 0.5% -1:200000 IJ SOLN
INTRAMUSCULAR | Status: AC
  Filled 2014-05-23: qty 30

## 2014-05-23 MED ORDER — 0.9 % SODIUM CHLORIDE (POUR BTL) OPTIME
TOPICAL | Status: DC | PRN
  Administered 2014-05-23: 1000 mL

## 2014-05-23 SURGICAL SUPPLY — 34 items
BAG HAMPER (MISCELLANEOUS) ×3 IMPLANT
CLOTH BEACON ORANGE TIMEOUT ST (SAFETY) ×3 IMPLANT
COVER LIGHT HANDLE STERIS (MISCELLANEOUS) ×6 IMPLANT
DECANTER SPIKE VIAL GLASS SM (MISCELLANEOUS) ×3 IMPLANT
DRAPE PROXIMA HALF (DRAPES) ×3 IMPLANT
DRAPE STERI URO 9X17 APER PCH (DRAPES) ×3 IMPLANT
ELECT REM PT RETURN 9FT ADLT (ELECTROSURGICAL) ×3
ELECTRODE REM PT RTRN 9FT ADLT (ELECTROSURGICAL) ×1 IMPLANT
FORMALIN 10 PREFIL 480ML (MISCELLANEOUS) ×3 IMPLANT
GAUZE PACKING 2X5 YD STRL (GAUZE/BANDAGES/DRESSINGS) ×2 IMPLANT
GLOVE BIOGEL M STRL SZ7.5 (GLOVE) ×2 IMPLANT
GLOVE BIOGEL PI IND STRL 7.0 (GLOVE) IMPLANT
GLOVE BIOGEL PI IND STRL 7.5 (GLOVE) IMPLANT
GLOVE BIOGEL PI IND STRL 9 (GLOVE) ×1 IMPLANT
GLOVE BIOGEL PI INDICATOR 7.0 (GLOVE) ×2
GLOVE BIOGEL PI INDICATOR 7.5 (GLOVE) ×2
GLOVE BIOGEL PI INDICATOR 9 (GLOVE) ×2
GLOVE ECLIPSE 6.5 STRL STRAW (GLOVE) ×2 IMPLANT
GLOVE ECLIPSE 9.0 STRL (GLOVE) ×3 IMPLANT
GLOVE EXAM NITRILE MD LF STRL (GLOVE) ×2 IMPLANT
GLOVE SKINSENSE 9.0 STRL ORNG (GLOVE) ×2 IMPLANT
GOWN SPEC L3 XXLG W/TWL (GOWN DISPOSABLE) ×4 IMPLANT
GOWN STRL REUS W/TWL LRG LVL3 (GOWN DISPOSABLE) ×7 IMPLANT
KIT ROOM TURNOVER AP CYSTO (KITS) ×3 IMPLANT
MANIFOLD NEPTUNE II (INSTRUMENTS) ×3 IMPLANT
NS IRRIG 1000ML POUR BTL (IV SOLUTION) ×3 IMPLANT
PACK PERI GYN (CUSTOM PROCEDURE TRAY) ×3 IMPLANT
PAD ARMBOARD 7.5X6 YLW CONV (MISCELLANEOUS) ×3 IMPLANT
SET BASIN LINEN APH (SET/KITS/TRAYS/PACK) ×3 IMPLANT
SUT CHROMIC 2 0 CT 1 (SUTURE) ×5 IMPLANT
SUT PROLENE 2 0 FS (SUTURE) IMPLANT
SUT VIC AB 0 CT2 8-18 (SUTURE) ×3 IMPLANT
SYR CONTROL 10ML LL (SYRINGE) ×2 IMPLANT
TRAY FOLEY CATH 16FR SILVER (SET/KITS/TRAYS/PACK) ×3 IMPLANT

## 2014-05-23 NOTE — Care Management Note (Signed)
    Page 1 of 1   05/23/2014     3:42:15 PM CARE MANAGEMENT NOTE 05/23/2014  Patient:  Ruth Barry, Ruth Barry   Account Number:  [id number]  Date Initiated:  05/23/2014  Documentation initiated by:  Jolene Provost  Subjective/Objective Assessment:   Pt is from home with self care. Pt has no HH serivces, DME's or med needs prior to admission.     Action/Plan:   Pt plans to discharge home with selfcare. No CM needs identified.   Anticipated DC Date:  05/24/2014   Anticipated DC Plan:  [place]  CM consult      Choice offered to / List presented to:             Status of service:  Completed, signed off Medicare Important Message given?   (If response is "NO", the following Medicare IM given date fields will be blank) Date Medicare IM given:   Medicare IM given by:   Date Additional Medicare IM given:   Additional Medicare IM given by:    Discharge Disposition:  HOME/SELF CARE  Per UR Regulation:    If discussed at Long Length of Stay Meetings, dates discussed:    Comments:  05/23/2014 [place], RN, MSN, [facility]

## 2014-05-23 NOTE — Transfer of Care (Signed)
Immediate Anesthesia Transfer of Care Note  Patient: Ruth Barry  Procedure(s) Performed: Procedure(s): ANTERIOR REPAIR (CYSTOCELE) (N/A) POSTERIOR REPAIR (RECTOCELE) (N/A)  Patient Location: PACU  Anesthesia Type:General  Level of Consciousness: awake, alert , patient cooperative and responds to stimulation  Airway & Oxygen Therapy: Patient Spontanous Breathing and Patient connected to face mask oxygen  Post-op Assessment: Report given to PACU RN, Post -op Vital signs reviewed and stable and Patient moving all extremities  Post vital signs: Reviewed and stable  Complications: No apparent anesthesia complications

## 2014-05-23 NOTE — Anesthesia Preprocedure Evaluation (Signed)
Anesthesia Evaluation  Patient identified by MRN, date of birth, ID band Patient awake    Reviewed: Allergy & Precautions, H&P , NPO status , Patient's Chart, lab work & pertinent test results  Airway Mallampati: II  TM Distance: >3 FB     Dental  (+) Teeth Intact   Pulmonary neg pulmonary ROS,  breath sounds clear to auscultation        Cardiovascular hypertension, Pt. on medications Rhythm:Regular Rate:Normal     Neuro/Psych    GI/Hepatic negative GI ROS,   Endo/Other    Renal/GU      Musculoskeletal   Abdominal   Peds  Hematology   Anesthesia Other Findings   Reproductive/Obstetrics                             Anesthesia Physical Anesthesia Plan  ASA: II  Anesthesia Plan: General   Post-op Pain Management:    Induction: Intravenous  Airway Management Planned: LMA  Additional Equipment:   Intra-op Plan:   Post-operative Plan: Extubation in OR  Informed Consent: I have reviewed the patients History and Physical, chart, labs and discussed the procedure including the risks, benefits and alternatives for the proposed anesthesia with the patient or authorized representative who has indicated his/her understanding and acceptance.     Plan Discussed with:   Anesthesia Plan Comments:         Anesthesia Quick Evaluation

## 2014-05-23 NOTE — Progress Notes (Signed)
Peri Pad changed post walking patient in hallway.  Minimal blood noted on peri pad and bedding.  Patient ambulated with assist down hallway. Foley catheter remains in place and draining.

## 2014-05-23 NOTE — Interval H&P Note (Signed)
History and Physical Interval Note:  05/23/2014 7:27 AM  Ruth Barry  has presented today for surgery, with the diagnosis of CYTOCELE  The various methods of treatment have been discussed with the patient and family. After consideration of risks, benefits and other options for treatment, the patient has consented to  Procedure(s): ANTERIOR REPAIR (CYSTOCELE) (N/A) as a surgical intervention .  The patient's history has been reviewed, patient examined, no change in status, stable for surgery.  I have reviewed the patient's chart and labs.  Questions were answered to the patient's satisfaction.     [name]

## 2014-05-23 NOTE — Brief Op Note (Signed)
05/23/2014  9:26 AM  PATIENT:  Ruth Barry  68 y.o. female  PRE-OPERATIVE DIAGNOSIS:  CYTOCELE,uterine descensus  POST-OPERATIVE DIAGNOSIS:  Cystocele, uterine descensus  PROCEDURE:  Procedure(s): ANTERIOR REPAIR (CYSTOCELE) (N/A) POSTERIOR REPAIR (RECTOCELE) (N/A)  SURGEON:  Surgeon(s) and Role:    * [name], MD - Primary  PHYSICIAN ASSISTANT:   ASSISTANTSPhilipp Ovens, CST, Ashley RN   ANESTHESIA:   local and general  EBL:  Total I/O In: 1100 [I.V.:1100] Out: 25 [Blood:25]  BLOOD ADMINISTERED:none  DRAINS: Urinary Catheter (Foley) and Vaginal packing with Betadine soaked gauze   LOCAL MEDICATIONS USED:  MARCAINE    and Amount: 15 ml  SPECIMEN:  Source of Specimen:  Anterior and posterior vaginal epithelium  DISPOSITION OF SPECIMEN:  PATHOLOGY  COUNTS:  YES  TOURNIQUET:  * No tourniquets in log *  DICTATION: .Dragon Dictation  PLAN OF CARE: Admit for overnight observation  PATIENT DISPOSITION:  PACU - hemodynamically stable.   Delay start of Pharmacological VTE agent (>24hrs) due to surgical blood loss or risk of bleeding: not applicable

## 2014-05-23 NOTE — Anesthesia Procedure Notes (Signed)
Procedure Name: LMA Insertion Date/Time: 05/23/2014 7:46 AM Performed by: Gershon Mussel, Marjoria Mancillas Pre-anesthesia Checklist: Patient identified, Emergency Drugs available, Suction available, Patient being monitored and Timeout performed Patient Re-evaluated:Patient Re-evaluated prior to inductionOxygen Delivery Method: Circle system utilized Preoxygenation: Pre-oxygenation with 100% oxygen Intubation Type: IV induction Ventilation: Mask ventilation without difficulty LMA: LMA inserted LMA Size: 4.0

## 2014-05-23 NOTE — Interval H&P Note (Signed)
History and Physical Interval Note:  05/23/2014 7:26 AM  Ruth Barry  has presented today for surgery, with the diagnosis of CYTOCELE  The various methods of treatment have been discussed with the patient and family. After consideration of risks, benefits and other options for treatment, the patient has consented to  Procedure(s): ANTERIOR REPAIR (CYSTOCELE) (N/A) as a surgical intervention .  The patient's history has been reviewed, patient examined, no change in status, stable for surgery.  I have reviewed the patient's chart and labs.  Questions were answered to the patient's satisfaction.     Ruth Barry

## 2014-05-23 NOTE — Anesthesia Postprocedure Evaluation (Signed)
  Anesthesia Post-op Note  Patient: Ruth Barry  Procedure(s) Performed: Procedure(s): ANTERIOR REPAIR (CYSTOCELE) (N/A) POSTERIOR REPAIR (RECTOCELE) (N/A)  Patient Location: PACU  Anesthesia Type:General  Level of Consciousness: awake, alert , oriented, patient cooperative and responds to stimulation  Airway and Oxygen Therapy: Patient Spontanous Breathing and Patient connected to face mask oxygen  Post-op Pain: none  Post-op Assessment: Post-op Vital signs reviewed, Patient's Cardiovascular Status Stable, Respiratory Function Stable, Patent Airway, No signs of Nausea or vomiting and Pain level controlled  Post-op Vital Signs: Reviewed and stable  Last Vitals:  Filed Vitals:   05/23/14 0915  BP:   Pulse: 87  Temp:   Resp: 13    Complications: No apparent anesthesia complications

## 2014-05-23 NOTE — Op Note (Signed)
05/23/2014  9:26 AM  PATIENT:  Ruth Barry  68 y.o. female  PRE-OPERATIVE DIAGNOSIS:  CYTOCELE,uterine descensus  POST-OPERATIVE DIAGNOSIS:  Cystocele, uterine descensus  PROCEDURE:  Procedure(s): ANTERIOR REPAIR (CYSTOCELE) (N/A) POSTERIOR REPAIR (RECTOCELE) (N/A)  SURGEON:  Surgeon(s) and Role:    * [name], MD - Primary  PHYSICIAN ASSISTANT:   ASSISTANTSPhilipp Ovens, CST, Ashley RN   ANESTHESIA:   local and general  EBL:  Total I/O In: 1100 [I.V.:1100] Out: 25 [Blood:25]  BLOOD ADMINISTERED:none  DRAINS: Urinary Catheter (Foley) and Vaginal packing with Betadine soaked gauze   LOCAL MEDICATIONS USED:  MARCAINE    and Amount: 15 ml  SPECIMEN:  Source of Specimen:  Anterior and posterior vaginal epithelium  DISPOSITION OF SPECIMEN:  PATHOLOGY  COUNTS:  YES  TOURNIQUET:  * No tourniquets in log *  DICTATION: .Dragon Dictation  PLAN OF CARE: Admit for overnight observation  PATIENT DISPOSITION:  PACU - hemodynamically stable.   Delay start of Pharmacological VTE agent (>24hrs) due to surgical blood loss or risk of bleeding: not applicable  Details of procedure:. Patient was taken to the operating room prepped and draped for vaginal procedure, with timeout conducted and procedure confirmed by surgical team. Ancef was administered. Cervix was easily visible and with minimal traction reached the sent to the introitus the large cystocele was present. Marcaine was injected beneath the cystocele. Foley catheter was inserted with 10 cc fluid placed and the Foley bulb to assist with identification of the bladder trigone. The anterior epithelium overlying the cystocele was then opened stay at the cervicovaginal junction, transversely stents of prepped slight 2.5cm,, and the epithelium blade in the midline for a distance of about 4 cm, and sharply dissected off of the underlying tissues, to reduce the redundancy of epithelium and the cystocele. The dissection did not  extend to the level of the trigone. A series of horizontal mattress sutures attached to the underside of the dissected epithelium were then placed beginning inferiorly and marching up toward the cervix, placing them approximately 4 mm away from the edge of the dissection on the back of the epithelium to ensure safety of bladder and ureters. These were then tied down in series, done the epithelium trimmed. 2-0 Vicryl was then used in continuous running fashion to reapproximate the epithelium edges, with significantly improved support anteriorly. Finally 2 interrupted 2-0 chromic sutures were placed to close the original dissection at the cervicovaginal junction Inspection of the perineum with the patient with the patient anesthetized revealed that the introitus was gaping sufficiently to allow 3 finger breast be placed transversely in the introitus and support was considered inadequate to allow appropriate support of the anterior repair. Given the more significant amount of laxity that was clearly obvious at this time was felt that a posterior repair was necessary to give adequate support to the anterior repair a Allis clamp was placed at 5 and 7:00 along the introitus at the level of the hymen remnants, and the epithelium trimmed between them. Posterior tissues were then infiltrated with Marcaine with epinephrine and then epithelium dissected off the underlying perineal tissues for a distance of 4 cm up the posterior part of the vagina. With lateral dissection to identify supportive tissues. A series of 4 vertical mattress sutures were then used to pull lateral supportive tissues into the perineal body and reinforced thickness of the perineal body, and reduce introitus size. Improved perineal support was achieved and the appropriate reduction in introitus size to allow 2 fingers  to be placed without difficulty. Improved perineal support was definitely present. Epithelium was pulled together with interrupted 2-0  chromic, and digital rectal exam at completion of the procedure confirmed that there was no disruption or sutures in the rectum. Patient was then had vaginal packing with Betadine soaked gauze covered with K-Y jelly and went to recovery in good condition clear urine was present throughout the case and patient to recovery room to inpatient observation overnight, sponge and needle counts correct.

## 2014-05-24 ENCOUNTER — Encounter (HOSPITAL_COMMUNITY): Payer: Self-pay | Admitting: Obstetrics and Gynecology

## 2014-05-24 DIAGNOSIS — N8189 Other female genital prolapse: Secondary | ICD-10-CM | POA: Diagnosis not present

## 2014-05-24 DIAGNOSIS — D251 Intramural leiomyoma of uterus: Secondary | ICD-10-CM | POA: Diagnosis not present

## 2014-05-24 DIAGNOSIS — I10 Essential (primary) hypertension: Secondary | ICD-10-CM | POA: Diagnosis not present

## 2014-05-24 DIAGNOSIS — N8110 Cystocele, unspecified: Secondary | ICD-10-CM | POA: Diagnosis not present

## 2014-05-24 DIAGNOSIS — Z79899 Other long term (current) drug therapy: Secondary | ICD-10-CM | POA: Diagnosis not present

## 2014-05-24 DIAGNOSIS — E785 Hyperlipidemia, unspecified: Secondary | ICD-10-CM | POA: Diagnosis not present

## 2014-05-24 LAB — BASIC METABOLIC PANEL
Anion gap: 10 (ref 5–15)
BUN: 12 mg/dL (ref 6–23)
CO2: 25 meq/L (ref 19–32)
Calcium: 8.3 mg/dL — ABNORMAL LOW (ref 8.4–10.5)
Chloride: 109 mEq/L (ref 96–112)
Creatinine, Ser: 0.69 mg/dL (ref 0.50–1.10)
GFR calc Af Amer: >90 mL/min (ref >90)
GFR calc non Af Amer: 88 mL/min — ABNORMAL LOW (ref >90)
Glucose, Bld: 89 mg/dL (ref 70–99)
POTASSIUM: 4.3 meq/L (ref 3.7–5.3)
SODIUM: 144 meq/L (ref 137–147)

## 2014-05-24 LAB — CBC
HCT: 34.1 % — ABNORMAL LOW (ref 36.0–46.0)
HEMOGLOBIN: 11.2 g/dL — AB (ref 12.0–15.0)
MCH: 29.9 pg (ref 26.0–34.0)
MCHC: 32.8 g/dL (ref 30.0–36.0)
MCV: 91.2 fL (ref 78.0–100.0)
Platelets: 189 10*3/uL (ref 150–400)
RBC: 3.74 MIL/uL — AB (ref 3.87–5.11)
RDW: 13.8 % (ref 11.5–15.5)
WBC: 6.8 10*3/uL (ref 4.0–10.5)

## 2014-05-24 MED ORDER — TRAMADOL HCL 50 MG PO TABS: 50.0000 mg | ORAL_TABLET | Freq: Four times a day (QID) | ORAL | Status: DC

## 2014-05-24 MED ORDER — POLYETHYLENE GLYCOL 3350 17 GM/SCOOP PO POWD
17.0000 g | Freq: Every day | ORAL | Status: DC
Start: 2014-05-24 — End: 2014-06-27

## 2014-05-24 NOTE — Addendum Note (Signed)
Addendum  created 05/24/14 1335 by Charmaine Downs, CRNA   Modules edited: Notes Section   Notes Section:  File: [id number]

## 2014-05-24 NOTE — Discharge Summary (Addendum)
Physician Discharge Summary  Patient ID: Ruth Barry MRN: [id number] DOB/AGE: March 14, 1946 68 y.o.  Admit date: 05/23/2014 Discharge date: 05/24/2014  Admission Diagnoses: Cystocele with pelvic relaxation  Discharge Diagnoses: Cystocele with pelvic relaxation Active Problems:   Bladder cystocele   Discharged Condition: good  Hospital Course: Patient was admitted to day surgery underwent anterior and posterior repair for cystocele and generalized perineal laxity. The patient had specifically declined consideration of hysterectomy. Estimated blood loss was less than 100 cc. The patient had had pain control had catheter removed the following morning we'll be able to void prior to discharge and will be sent home on mild analgesics and miralax  Consults: None  Significant Diagnostic Studies: labs:  CBC    Component Value Date/Time   WBC 6.8 05/24/2014 0518   WBC 4.3 04/04/2014   RBC 3.74* 05/24/2014 0518   HGB 11.2* 05/24/2014 0518   HCT 34.1* 05/24/2014 0518   PLT 189 05/24/2014 0518   MCV 91.2 05/24/2014 0518   MCH 29.9 05/24/2014 0518   MCHC 32.8 05/24/2014 0518   RDW 13.8 05/24/2014 0518    BMET    Component Value Date/Time   NA 144 05/24/2014 0518   K 4.3 05/24/2014 0518   CL 109 05/24/2014 0518   CO2 25 05/24/2014 0518   GLUCOSE 89 05/24/2014 0518   BUN 12 05/24/2014 0518   CREATININE 0.69 05/24/2014 0518   CALCIUM 8.3* 05/24/2014 0518   GFRNONAA 88* 05/24/2014 0518   GFRAA >90 05/24/2014 0518      Treatments: surgery: Anterior and posterior repair of the vagina  Discharge Exam: Blood pressure 125/71, pulse 66, temperature 97.2 F (36.2 C), temperature source Oral, resp. rate 18, height 5' (1.524 m), weight 114 lb 8 oz (51.937 kg), SpO2 99 %. Patient was alert cooperative oriented, with no perineal swelling. Vaginal packing is removed and patient will be allowed to void prior to discharge  Disposition: Discharge home  Discharge Instructions    Call MD  for:  persistant nausea and vomiting    Complete by:  As directed      Call MD for:  severe uncontrolled pain    Complete by:  As directed      Call MD for:  temperature >100.4    Complete by:  As directed      Call MD for:    Complete by:  As directed   Difficulty voiding     Diet - low sodium heart healthy    Complete by:  As directed      Discharge instructions    Complete by:  As directed   Avoid constipation, use miralax daily one capful (17 gm)     Increase activity slowly    Complete by:  As directed      No wound care    Complete by:  As directed             Medication List    TAKE these medications        amLODipine 5 MG tablet  Commonly known as:  NORVASC  Take 5 mg by mouth daily.     benazepril 20 MG tablet  Commonly known as:  LOTENSIN  Take 20 mg by mouth daily.     lovastatin 40 MG tablet  Commonly known as:  MEVACOR  Take 40 mg by mouth at bedtime.     polyethylene glycol powder powder  Commonly known as:  MIRALAX  Take 17 g by mouth daily. To prevent constipation  traMADol 50 MG tablet  Commonly known as:  ULTRAM  Take 1 tablet (50 mg total) by mouth every 6 (six) hours.           Follow-up Information    Follow up with [name], MD.   Specialties:  Obstetrics and Gynecology, Radiology   Why:  For wound re-check   Contact information:   [place] [address] [phone]       Signed: [name] 05/24/2014, 8:26 AM

## 2014-05-24 NOTE — Progress Notes (Signed)
Pt able to eat crackers and drink ginger. Pt has no complaints of nausea and vomiting or discomfort after eating. Diet advanced to regular diet. Pt states she did not want to walk anymore tonight and will walk in the morning.

## 2014-05-24 NOTE — Progress Notes (Signed)
Catheter removed from pt. Pt tolerated well. Peri care performed. New peri pad put on pt. Minimal spotting observed. No blood on new peri pad. Pt resting comfortably in bed.

## 2014-05-24 NOTE — Progress Notes (Signed)
Peri Pad changed post walking patient in hallway. Minimal blood noted on peri pad and bedding. Patient ambulated with assist down hallway.

## 2014-05-24 NOTE — Anesthesia Postprocedure Evaluation (Signed)
  Anesthesia Post-op Note  Patient: Ruth Barry  Procedure(s) Performed: Procedure(s): ANTERIOR REPAIR (CYSTOCELE) (N/A) POSTERIOR REPAIR (RECTOCELE) (N/A)  Patient Location: room 3a  Anesthesia Type:General  Level of Consciousness: awake, alert , oriented and patient cooperative  Airway and Oxygen Therapy: Patient Spontanous Breathing  Post-op Pain: 2 /10, mild  Post-op Assessment: Post-op Vital signs reviewed, Patient's Cardiovascular Status Stable, Respiratory Function Stable, Patent Airway, No signs of Nausea or vomiting and Pain level controlled  Post-op Vital Signs: Reviewed and stable  Last Vitals:  Filed Vitals:   05/24/14 0700  BP: 125/71  Pulse: 66  Temp: 36.2 C  Resp: 18    Complications: No apparent anesthesia complications

## 2014-05-30 ENCOUNTER — Encounter: Payer: Self-pay | Admitting: Obstetrics and Gynecology

## 2014-05-30 ENCOUNTER — Ambulatory Visit (INDEPENDENT_AMBULATORY_CARE_PROVIDER_SITE_OTHER): Payer: Medicare Other | Admitting: Obstetrics and Gynecology

## 2014-05-30 VITALS — BP 130/90 | Ht 60.0 in | Wt 113.5 lb

## 2014-05-30 DIAGNOSIS — Z9889 Other specified postprocedural states: Secondary | ICD-10-CM

## 2014-05-30 DIAGNOSIS — IMO0002 Reserved for concepts with insufficient information to code with codable children: Secondary | ICD-10-CM

## 2014-05-30 NOTE — Progress Notes (Signed)
Patient ID: Ruth Barry, female   DOB: May 17, 1946, 68 y.o.   MRN: [id number] Pt here today for post op visit. Pt had surgery last Tuesday and denies any problems or concerns at this t   Subjective:  Ruth Barry is a 68 y.o. female who presents to the clinic 1 weeks status post anterior colporrhaphy and posterior colporrhaphy.   no c.o Review of Systems Negative except   She has been eating a regular diet without difficulty.   Bowel movements are normal. Pain is controlled without any medications.  Objective:  BP 130/90 mmHg  Ht 5' (1.524 m)  Wt 113 lb 8 oz (51.483 kg)  BMI 22.17 kg/m2 General:Well developed, well nourished.  No acute distress. Abdomen: Bowel sounds normal, soft, non-tender. Pelvic Exam:    External Genitalia:  Normal.    Vagina: Normal    Bimanual: Normal   Incision(s):   Healing well, no drainage, no erythema, no hernia, no swelling, no dehiscence, incision well approximated.pt will get metrogel to expedite healing   Assessment:  Post-Op 1 weeks s/p anterior colporrhaphy and posterior colporrhaphy    Doing well postoperatively.   Plan:  1.Wound care discussed   2. .Continue any current medications. 3. Activity restrictions:  4. return to work: at reduced duties. 5. Follow up in 4 week.

## 2014-06-27 ENCOUNTER — Encounter: Payer: Self-pay | Admitting: Obstetrics and Gynecology

## 2014-06-27 ENCOUNTER — Ambulatory Visit (INDEPENDENT_AMBULATORY_CARE_PROVIDER_SITE_OTHER): Payer: Medicare Other | Admitting: Obstetrics and Gynecology

## 2014-06-27 VITALS — BP 130/80 | Ht 62.0 in | Wt 113.0 lb

## 2014-06-27 DIAGNOSIS — H00013 Hordeolum externum right eye, unspecified eyelid: Secondary | ICD-10-CM

## 2014-06-27 DIAGNOSIS — Z9889 Other specified postprocedural states: Secondary | ICD-10-CM

## 2014-06-27 DIAGNOSIS — H00019 Hordeolum externum unspecified eye, unspecified eyelid: Secondary | ICD-10-CM | POA: Insufficient documentation

## 2014-06-27 MED ORDER — TRIPLE ANTIBIOTIC 5-400-5000 EX OINT: TOPICAL_OINTMENT | Freq: Four times a day (QID) | CUTANEOUS | Status: AC

## 2014-06-27 NOTE — Progress Notes (Addendum)
Patient ID: Ruth Barry, female   DOB: [birth year], 69 y.o.   MRN: [id number] Pt here today for follow up from surgery. Pt denies any problems or concerns at this time.    Subjective:  Ruth Barry is a 69 y.o. female who presents to the clinic 5 weeks status post anterior colporrhaphy and posterior colporrhaphy.   feels well has not been sexually active. Review of System Negative except none note  She has been eating a regular diet without difficulty.   Bowel movements are normal. The patient is not having any pain.  Objective:  BP 130/80 mmHg  Ht 5\' 2"  (1.575 m)  Wt 113 lb (51.256 kg)  BMI 20.66 kg/m2 General:Well developed, well nourished.  No acute distress. Abdomen: Bowel sounds normal, soft, non-tender. Pelvic Exam:    External Genitalia:  Normal.    Vagina: Normal    Bimanual: Normal   Cervix: Normal    Uterus: Normal    Adnexa: Normal Perineal support excellent Incision(s):   Healing well, no drainage, no erythema, no hernia, no swelling, no dehiscence, incision well approximated.no descent.   Assessment:  Post-Op 5 weeks s/p anterior colporrhaphy and posterior colporrhaphy    Doing well postoperatively.   Plan:  1.Wound care discussed  May resume normal activity including sex 2. .Continue any current medications. 3. Activity restrictions: none 4. return to work: not applicable. 5. Follow up in prn probs .Marland Kitchen   Also, had a stye on right upper eyelid, given info from [facility] to date and rx'd sulfacetamide.

## 2014-06-27 NOTE — Addendum Note (Signed)
Addended by: [name] on: 06/27/2014 02:44 PM   Modules accepted: Orders

## 2015-02-26 DIAGNOSIS — Z23 Encounter for immunization: Secondary | ICD-10-CM | POA: Diagnosis not present

## 2015-02-27 DIAGNOSIS — I10 Essential (primary) hypertension: Secondary | ICD-10-CM | POA: Diagnosis not present

## 2015-02-27 DIAGNOSIS — E784 Other hyperlipidemia: Secondary | ICD-10-CM | POA: Diagnosis not present

## 2015-12-25 DIAGNOSIS — I10 Essential (primary) hypertension: Secondary | ICD-10-CM | POA: Diagnosis not present

## 2015-12-25 DIAGNOSIS — E784 Other hyperlipidemia: Secondary | ICD-10-CM | POA: Diagnosis not present

## 2015-12-25 DIAGNOSIS — N8111 Cystocele, midline: Secondary | ICD-10-CM | POA: Diagnosis not present

## 2016-02-28 DIAGNOSIS — Z23 Encounter for immunization: Secondary | ICD-10-CM | POA: Diagnosis not present

## 2017-03-16 DIAGNOSIS — I10 Essential (primary) hypertension: Secondary | ICD-10-CM | POA: Diagnosis not present

## 2017-03-16 DIAGNOSIS — Z23 Encounter for immunization: Secondary | ICD-10-CM | POA: Diagnosis not present

## 2017-03-16 DIAGNOSIS — E7849 Other hyperlipidemia: Secondary | ICD-10-CM | POA: Diagnosis not present

## 2017-03-16 LAB — TSH: TSH: 1.11 (ref <=5.90)

## 2017-04-21 DIAGNOSIS — L989 Disorder of the skin and subcutaneous tissue, unspecified: Secondary | ICD-10-CM | POA: Diagnosis not present

## 2017-04-21 DIAGNOSIS — I10 Essential (primary) hypertension: Secondary | ICD-10-CM | POA: Diagnosis not present

## 2017-04-21 DIAGNOSIS — E785 Hyperlipidemia, unspecified: Secondary | ICD-10-CM | POA: Diagnosis not present

## 2017-04-21 DIAGNOSIS — Z0000 Encounter for general adult medical examination without abnormal findings: Secondary | ICD-10-CM | POA: Diagnosis not present

## 2017-04-22 ENCOUNTER — Other Ambulatory Visit (HOSPITAL_COMMUNITY): Payer: Self-pay | Admitting: Pulmonary Disease

## 2017-04-22 DIAGNOSIS — Z780 Asymptomatic menopausal state: Secondary | ICD-10-CM

## 2017-04-28 ENCOUNTER — Ambulatory Visit (HOSPITAL_COMMUNITY)
Admission: RE | Admit: 2017-04-28 | Discharge: 2017-04-28 | Disposition: A | Discharge status: Home / Self Care | Payer: Medicare Other | Source: Ambulatory Visit | Attending: Pulmonary Disease | Admitting: Pulmonary Disease

## 2017-04-28 DIAGNOSIS — Z780 Asymptomatic menopausal state: Secondary | ICD-10-CM

## 2017-04-28 DIAGNOSIS — M810 Age-related osteoporosis without current pathological fracture: Secondary | ICD-10-CM | POA: Diagnosis not present

## 2017-04-28 LAB — HM DEXA SCAN

## 2018-03-04 DIAGNOSIS — Z23 Encounter for immunization: Secondary | ICD-10-CM | POA: Diagnosis not present

## 2018-03-18 DIAGNOSIS — I10 Essential (primary) hypertension: Secondary | ICD-10-CM | POA: Diagnosis not present

## 2018-03-18 DIAGNOSIS — L989 Disorder of the skin and subcutaneous tissue, unspecified: Secondary | ICD-10-CM | POA: Diagnosis not present

## 2018-03-18 DIAGNOSIS — E7849 Other hyperlipidemia: Secondary | ICD-10-CM | POA: Diagnosis not present

## 2019-01-27 DIAGNOSIS — L989 Disorder of the skin and subcutaneous tissue, unspecified: Secondary | ICD-10-CM | POA: Diagnosis not present

## 2019-01-27 DIAGNOSIS — E7849 Other hyperlipidemia: Secondary | ICD-10-CM | POA: Diagnosis not present

## 2019-01-27 DIAGNOSIS — I10 Essential (primary) hypertension: Secondary | ICD-10-CM | POA: Diagnosis not present

## 2019-01-27 LAB — BASIC METABOLIC PANEL
BUN: 18 (ref 4–21)
CO2: 28 — AB (ref 13–22)
Chloride: 105 (ref 99–108)
Creatinine: 0.9 (ref <=1.1)
Glucose: 98
Potassium: 5.1 (ref 3.4–5.3)
Sodium: 142 (ref 137–147)

## 2019-01-27 LAB — CBC AND DIFFERENTIAL
HCT: 42 (ref 36–46)
Hemoglobin: 13.9 (ref 12.0–16.0)
Platelets: 234 (ref 150–399)
WBC: 4.2

## 2019-01-27 LAB — HEPATIC FUNCTION PANEL
ALT: 15 (ref 7–35)
AST: 24 (ref 13–35)
Alkaline Phosphatase: 58 (ref 25–125)
Bilirubin, Total: 0.8

## 2019-01-27 LAB — CBC: RBC: 4.64 (ref 3.87–5.11)

## 2019-01-27 LAB — LIPID PANEL
Cholesterol: 211 — AB (ref 0–200)
HDL: 67 (ref 35–70)
LDL Cholesterol: 127
Triglycerides: 73 (ref 40–160)

## 2019-01-27 LAB — COMPREHENSIVE METABOLIC PANEL
Calcium: 9.9 (ref 8.7–10.7)
GFR calc Af Amer: 77
GFR calc non Af Amer: 67

## 2019-03-11 DIAGNOSIS — Z23 Encounter for immunization: Secondary | ICD-10-CM | POA: Diagnosis not present

## 2019-06-19 DIAGNOSIS — L989 Disorder of the skin and subcutaneous tissue, unspecified: Secondary | ICD-10-CM | POA: Insufficient documentation

## 2019-06-19 DIAGNOSIS — I10 Essential (primary) hypertension: Secondary | ICD-10-CM | POA: Insufficient documentation

## 2019-06-19 DIAGNOSIS — E7849 Other hyperlipidemia: Secondary | ICD-10-CM

## 2019-08-04 DIAGNOSIS — Z23 Encounter for immunization: Secondary | ICD-10-CM | POA: Diagnosis not present

## 2019-08-31 DIAGNOSIS — E785 Hyperlipidemia, unspecified: Secondary | ICD-10-CM | POA: Diagnosis not present

## 2019-08-31 DIAGNOSIS — Z0189 Encounter for other specified special examinations: Secondary | ICD-10-CM | POA: Diagnosis not present

## 2019-08-31 DIAGNOSIS — I10 Essential (primary) hypertension: Secondary | ICD-10-CM | POA: Diagnosis not present

## 2019-09-02 DIAGNOSIS — Z23 Encounter for immunization: Secondary | ICD-10-CM | POA: Diagnosis not present

## 2019-10-13 ENCOUNTER — Ambulatory Visit: Payer: Medicare Other | Admitting: Family Medicine

## 2019-12-08 DIAGNOSIS — E785 Hyperlipidemia, unspecified: Secondary | ICD-10-CM | POA: Diagnosis not present

## 2019-12-08 DIAGNOSIS — L989 Disorder of the skin and subcutaneous tissue, unspecified: Secondary | ICD-10-CM | POA: Diagnosis not present

## 2019-12-08 DIAGNOSIS — I10 Essential (primary) hypertension: Secondary | ICD-10-CM | POA: Diagnosis not present

## 2020-03-16 DIAGNOSIS — Z23 Encounter for immunization: Secondary | ICD-10-CM | POA: Diagnosis not present

## 2020-04-04 DIAGNOSIS — Z23 Encounter for immunization: Secondary | ICD-10-CM | POA: Diagnosis not present

## 2020-06-14 DIAGNOSIS — L989 Disorder of the skin and subcutaneous tissue, unspecified: Secondary | ICD-10-CM | POA: Diagnosis not present

## 2020-06-14 DIAGNOSIS — E785 Hyperlipidemia, unspecified: Secondary | ICD-10-CM | POA: Diagnosis not present

## 2020-06-14 DIAGNOSIS — I10 Essential (primary) hypertension: Secondary | ICD-10-CM | POA: Diagnosis not present

## 2020-10-29 DIAGNOSIS — I10 Essential (primary) hypertension: Secondary | ICD-10-CM | POA: Diagnosis not present

## 2020-11-01 DIAGNOSIS — I10 Essential (primary) hypertension: Secondary | ICD-10-CM | POA: Diagnosis not present

## 2020-11-01 DIAGNOSIS — Z0001 Encounter for general adult medical examination with abnormal findings: Secondary | ICD-10-CM | POA: Diagnosis not present

## 2020-11-01 DIAGNOSIS — E785 Hyperlipidemia, unspecified: Secondary | ICD-10-CM | POA: Diagnosis not present

## 2020-11-22 DIAGNOSIS — H905 Unspecified sensorineural hearing loss: Secondary | ICD-10-CM | POA: Diagnosis not present

## 2020-11-30 DIAGNOSIS — H905 Unspecified sensorineural hearing loss: Secondary | ICD-10-CM | POA: Diagnosis not present

## 2021-03-13 DIAGNOSIS — Z23 Encounter for immunization: Secondary | ICD-10-CM | POA: Diagnosis not present

## 2021-03-25 DIAGNOSIS — I10 Essential (primary) hypertension: Secondary | ICD-10-CM | POA: Diagnosis not present

## 2021-03-27 DIAGNOSIS — I10 Essential (primary) hypertension: Secondary | ICD-10-CM | POA: Diagnosis not present

## 2021-03-27 DIAGNOSIS — R7303 Prediabetes: Secondary | ICD-10-CM | POA: Diagnosis not present

## 2021-03-27 DIAGNOSIS — Z0001 Encounter for general adult medical examination with abnormal findings: Secondary | ICD-10-CM | POA: Diagnosis not present

## 2021-03-27 DIAGNOSIS — L409 Psoriasis, unspecified: Secondary | ICD-10-CM | POA: Diagnosis not present

## 2021-03-27 DIAGNOSIS — E785 Hyperlipidemia, unspecified: Secondary | ICD-10-CM | POA: Diagnosis not present

## 2021-05-29 DIAGNOSIS — H524 Presbyopia: Secondary | ICD-10-CM | POA: Diagnosis not present

## 2021-05-29 DIAGNOSIS — Z0100 Encounter for examination of eyes and vision without abnormal findings: Secondary | ICD-10-CM | POA: Diagnosis not present

## 2021-09-23 DIAGNOSIS — R7303 Prediabetes: Secondary | ICD-10-CM | POA: Diagnosis not present

## 2021-09-23 DIAGNOSIS — E785 Hyperlipidemia, unspecified: Secondary | ICD-10-CM | POA: Diagnosis not present

## 2021-09-25 DIAGNOSIS — R7303 Prediabetes: Secondary | ICD-10-CM | POA: Diagnosis not present

## 2021-09-25 DIAGNOSIS — E875 Hyperkalemia: Secondary | ICD-10-CM | POA: Diagnosis not present

## 2021-09-25 DIAGNOSIS — I10 Essential (primary) hypertension: Secondary | ICD-10-CM | POA: Diagnosis not present

## 2021-09-25 DIAGNOSIS — E785 Hyperlipidemia, unspecified: Secondary | ICD-10-CM | POA: Diagnosis not present

## 2021-09-25 DIAGNOSIS — L409 Psoriasis, unspecified: Secondary | ICD-10-CM | POA: Diagnosis not present

## 2022-03-07 DIAGNOSIS — Z23 Encounter for immunization: Secondary | ICD-10-CM | POA: Diagnosis not present

## 2022-03-20 DIAGNOSIS — I10 Essential (primary) hypertension: Secondary | ICD-10-CM | POA: Diagnosis not present

## 2022-03-20 DIAGNOSIS — R7303 Prediabetes: Secondary | ICD-10-CM | POA: Diagnosis not present

## 2022-03-20 DIAGNOSIS — E785 Hyperlipidemia, unspecified: Secondary | ICD-10-CM | POA: Diagnosis not present

## 2022-03-27 DIAGNOSIS — I10 Essential (primary) hypertension: Secondary | ICD-10-CM | POA: Diagnosis not present

## 2022-03-27 DIAGNOSIS — R7303 Prediabetes: Secondary | ICD-10-CM | POA: Diagnosis not present

## 2022-03-27 DIAGNOSIS — E875 Hyperkalemia: Secondary | ICD-10-CM | POA: Diagnosis not present

## 2022-03-27 DIAGNOSIS — Z5320 Procedure and treatment not carried out because of patient's decision for unspecified reasons: Secondary | ICD-10-CM | POA: Diagnosis not present

## 2022-03-27 DIAGNOSIS — L409 Psoriasis, unspecified: Secondary | ICD-10-CM | POA: Diagnosis not present

## 2022-03-27 DIAGNOSIS — E785 Hyperlipidemia, unspecified: Secondary | ICD-10-CM | POA: Diagnosis not present

## 2022-11-11 DIAGNOSIS — R7303 Prediabetes: Secondary | ICD-10-CM | POA: Diagnosis not present

## 2022-11-11 DIAGNOSIS — I10 Essential (primary) hypertension: Secondary | ICD-10-CM | POA: Diagnosis not present

## 2022-11-11 DIAGNOSIS — E785 Hyperlipidemia, unspecified: Secondary | ICD-10-CM | POA: Diagnosis not present

## 2022-12-02 DIAGNOSIS — Z79899 Other long term (current) drug therapy: Secondary | ICD-10-CM | POA: Diagnosis not present

## 2022-12-02 DIAGNOSIS — E875 Hyperkalemia: Secondary | ICD-10-CM | POA: Diagnosis not present

## 2022-12-02 DIAGNOSIS — L409 Psoriasis, unspecified: Secondary | ICD-10-CM | POA: Diagnosis not present

## 2022-12-02 DIAGNOSIS — I10 Essential (primary) hypertension: Secondary | ICD-10-CM | POA: Diagnosis not present

## 2022-12-02 DIAGNOSIS — Z0001 Encounter for general adult medical examination with abnormal findings: Secondary | ICD-10-CM | POA: Diagnosis not present

## 2022-12-02 DIAGNOSIS — Z6820 Body mass index (BMI) 20.0-20.9, adult: Secondary | ICD-10-CM | POA: Diagnosis not present

## 2022-12-02 DIAGNOSIS — R7303 Prediabetes: Secondary | ICD-10-CM | POA: Diagnosis not present

## 2022-12-02 DIAGNOSIS — M79604 Pain in right leg: Secondary | ICD-10-CM | POA: Diagnosis not present

## 2022-12-02 DIAGNOSIS — E785 Hyperlipidemia, unspecified: Secondary | ICD-10-CM | POA: Diagnosis not present

## 2023-01-19 DIAGNOSIS — R2240 Localized swelling, mass and lump, unspecified lower limb: Secondary | ICD-10-CM | POA: Diagnosis not present

## 2023-01-19 DIAGNOSIS — R2242 Localized swelling, mass and lump, left lower limb: Secondary | ICD-10-CM | POA: Diagnosis not present

## 2023-03-09 DIAGNOSIS — Z23 Encounter for immunization: Secondary | ICD-10-CM | POA: Diagnosis not present

## 2023-07-28 DIAGNOSIS — I10 Essential (primary) hypertension: Secondary | ICD-10-CM | POA: Diagnosis not present

## 2023-07-28 DIAGNOSIS — R7303 Prediabetes: Secondary | ICD-10-CM | POA: Diagnosis not present

## 2023-08-04 DIAGNOSIS — R7303 Prediabetes: Secondary | ICD-10-CM | POA: Diagnosis not present

## 2023-08-04 DIAGNOSIS — Z5320 Procedure and treatment not carried out because of patient's decision for unspecified reasons: Secondary | ICD-10-CM | POA: Diagnosis not present

## 2023-08-04 DIAGNOSIS — L409 Psoriasis, unspecified: Secondary | ICD-10-CM | POA: Diagnosis not present

## 2023-08-04 DIAGNOSIS — E875 Hyperkalemia: Secondary | ICD-10-CM | POA: Diagnosis not present

## 2023-08-04 DIAGNOSIS — Z713 Dietary counseling and surveillance: Secondary | ICD-10-CM | POA: Diagnosis not present

## 2023-08-04 DIAGNOSIS — E785 Hyperlipidemia, unspecified: Secondary | ICD-10-CM | POA: Diagnosis not present

## 2023-08-04 DIAGNOSIS — M79604 Pain in right leg: Secondary | ICD-10-CM | POA: Diagnosis not present

## 2023-08-04 DIAGNOSIS — L989 Disorder of the skin and subcutaneous tissue, unspecified: Secondary | ICD-10-CM | POA: Diagnosis not present

## 2023-08-04 DIAGNOSIS — I10 Essential (primary) hypertension: Secondary | ICD-10-CM | POA: Diagnosis not present

## 2023-08-24 DIAGNOSIS — L820 Inflamed seborrheic keratosis: Secondary | ICD-10-CM | POA: Diagnosis not present

## 2024-01-26 DIAGNOSIS — R7303 Prediabetes: Secondary | ICD-10-CM | POA: Diagnosis not present

## 2024-01-26 DIAGNOSIS — I10 Essential (primary) hypertension: Secondary | ICD-10-CM | POA: Diagnosis not present

## 2024-01-26 DIAGNOSIS — E785 Hyperlipidemia, unspecified: Secondary | ICD-10-CM | POA: Diagnosis not present

## 2024-02-01 DIAGNOSIS — L409 Psoriasis, unspecified: Secondary | ICD-10-CM | POA: Diagnosis not present

## 2024-02-01 DIAGNOSIS — I10 Essential (primary) hypertension: Secondary | ICD-10-CM | POA: Diagnosis not present

## 2024-02-01 DIAGNOSIS — Z6820 Body mass index (BMI) 20.0-20.9, adult: Secondary | ICD-10-CM | POA: Diagnosis not present

## 2024-02-01 DIAGNOSIS — E875 Hyperkalemia: Secondary | ICD-10-CM | POA: Diagnosis not present

## 2024-02-01 DIAGNOSIS — Z713 Dietary counseling and surveillance: Secondary | ICD-10-CM | POA: Diagnosis not present

## 2024-02-01 DIAGNOSIS — Z5320 Procedure and treatment not carried out because of patient's decision for unspecified reasons: Secondary | ICD-10-CM | POA: Diagnosis not present

## 2024-02-01 DIAGNOSIS — R7303 Prediabetes: Secondary | ICD-10-CM | POA: Diagnosis not present

## 2024-02-01 DIAGNOSIS — E785 Hyperlipidemia, unspecified: Secondary | ICD-10-CM | POA: Diagnosis not present

## 2024-02-01 DIAGNOSIS — Z79899 Other long term (current) drug therapy: Secondary | ICD-10-CM | POA: Diagnosis not present

## 2024-03-22 DIAGNOSIS — Z23 Encounter for immunization: Secondary | ICD-10-CM | POA: Diagnosis not present
